# Patient Record
Sex: Female | Born: 1995 | Race: Asian | Hispanic: No | Marital: Single | Smoking: Never smoker
Health system: Southern US, Community
[De-identification: ages and names within clinical notes are randomized; demographics above are authoritative.]

## PROBLEM LIST (undated history)

## (undated) HISTORY — PX: LAPAROSCOPIC GASTRIC SLEEVE RESECTION: SHX5895

---
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
## 2020-07-16 ENCOUNTER — Emergency Department (HOSPITAL_COMMUNITY): Payer: No Typology Code available for payment source

## 2020-07-16 ENCOUNTER — Emergency Department (HOSPITAL_COMMUNITY)
Admission: EM | Admit: 2020-07-16 | Discharge: 2020-07-16 | Disposition: A | Discharge status: Home / Self Care | Payer: No Typology Code available for payment source | Attending: Emergency Medicine | Admitting: Emergency Medicine

## 2020-07-16 ENCOUNTER — Encounter (HOSPITAL_COMMUNITY): Payer: Self-pay | Admitting: *Deleted

## 2020-07-16 DIAGNOSIS — R000 Tachycardia, unspecified: Secondary | ICD-10-CM | POA: Insufficient documentation

## 2020-07-16 DIAGNOSIS — Y990 Civilian activity done for income or pay: Secondary | ICD-10-CM | POA: Diagnosis not present

## 2020-07-16 DIAGNOSIS — X501XXA Overexertion from prolonged static or awkward postures, initial encounter: Secondary | ICD-10-CM | POA: Diagnosis not present

## 2020-07-16 DIAGNOSIS — S93602A Unspecified sprain of left foot, initial encounter: Secondary | ICD-10-CM | POA: Insufficient documentation

## 2020-07-16 DIAGNOSIS — S99922A Unspecified injury of left foot, initial encounter: Secondary | ICD-10-CM | POA: Diagnosis present

## 2020-07-16 NOTE — Discharge Instructions (Signed)
The crutches as often as you need if it is very painful to walk.  Continue to ice.  You can take 2 Tylenol and 2 ibuprofen at the same time every 6 hours for pain.

## 2020-07-16 NOTE — ED Triage Notes (Signed)
Pt complains of left foot pain since falling yesterday. Painful with weight bearing, she is using crutches.

## 2020-07-16 NOTE — ED Provider Notes (Signed)
[facility]-EMERGENCY DEPT Provider Note   CSN: [id number] Arrival date & time: 07/16/20  1009     History Chief Complaint  Patient presents with  . Foot Pain    Paige Mcfarland is a 25 y.o. female.  The history is provided by the patient.  Foot Pain This is a new (Patient was walking at work and slipped and twisted her left foot) problem. The current episode started yesterday. The problem occurs constantly. The problem has not changed since onset.Associated symptoms comments: Persistent pain and swelling in the left foot.  Unable to bear weight because it is too painful.  No other areas of injury.. The symptoms are aggravated by walking. The symptoms are relieved by rest and ice. She has tried rest for the symptoms. The treatment provided mild relief.       History reviewed. No pertinent past medical history.  There are no problems to display for this patient.   History reviewed. No pertinent surgical history.   OB History   No obstetric history on file.     No family history on file.     Home Medications Prior to Admission medications   Not on File    Allergies    Patient has no allergy information on record.  Review of Systems   Review of Systems  All other systems reviewed and are negative.   Physical Exam Updated Vital Signs BP 127/87 (BP Location: Right Arm)   Pulse (!) 107   Temp 98 F (36.7 C) (Oral)   Resp 18   Ht 5\' 5"  (1.651 m)   Wt 126.1 kg   SpO2 99%   BMI 46.26 kg/m   Physical Exam Vitals and nursing note reviewed.  Constitutional:      General: She is not in acute distress.    Appearance: Normal appearance.  HENT:     Head: Normocephalic.  Cardiovascular:     Rate and Rhythm: Tachycardia present.     Pulses: Normal pulses.  Pulmonary:     Effort: Pulmonary effort is normal.  Musculoskeletal:        General: Tenderness present.     Left ankle: Normal.     Left foot: Tenderness and bony tenderness present.        Feet:  Skin:    General: Skin is warm and dry.  Neurological:     General: No focal deficit present.     Mental Status: She is alert and oriented to person, place, and time. Mental status is at baseline.  Psychiatric:        Mood and Affect: Mood normal.        Behavior: Behavior normal.     ED Results / Procedures / Treatments   Labs (all labs ordered are listed, but only abnormal results are displayed) Labs Reviewed - No data to display  EKG None  Radiology DG Foot Complete Left  Result Date: 07/16/2020 CLINICAL DATA:  Twisting injury yesterday with lateral foot pain, initial encounter EXAM: LEFT FOOT - COMPLETE 3+ VIEW COMPARISON:  None. FINDINGS: There is no evidence of fracture or dislocation. There is no evidence of arthropathy or other focal bone abnormality. Soft tissues are unremarkable. IMPRESSION: No acute abnormality noted. Electronically Signed   By: 07/18/2020 M.D.   On: 07/16/2020 11:10    Procedures Procedures   Medications Ordered in ED Medications - No data to display  ED Course  I have reviewed the triage vital signs and the nursing notes.  Pertinent labs & imaging results that were available during my care of the patient were reviewed by me and considered in my medical decision making (see chart for details).    MDM Rules/Calculators/A&P                          Patient presenting after injury to the foot yesterday and inability to bear weight due to pain.  No evidence of ankle or knee injury.  Plain films pending 11:51 AM Patient treated for sprain.  X-ray is negative.  Will give follow-up with sports medicine if symptoms do not improve.  MDM Number of Diagnoses or Management Options   Amount and/or Complexity of Data Reviewed Tests in the radiology section of CPT: ordered and reviewed Independent visualization of images, tracings, or specimens: yes     Final Clinical Impression(s) / ED Diagnoses Final diagnoses:  Sprain of left  foot, initial encounter    Rx / DC Orders ED Discharge Orders    None       Gwyneth Sprout, MD 07/16/20 1151

## 2020-07-31 ENCOUNTER — Emergency Department (HOSPITAL_BASED_OUTPATIENT_CLINIC_OR_DEPARTMENT_OTHER)
Admission: EM | Admit: 2020-07-31 | Discharge: 2020-07-31 | Disposition: A | Discharge status: Home / Self Care | Payer: Medicaid Other | Attending: Emergency Medicine | Admitting: Emergency Medicine

## 2020-07-31 ENCOUNTER — Other Ambulatory Visit: Payer: Self-pay

## 2020-07-31 ENCOUNTER — Encounter (HOSPITAL_BASED_OUTPATIENT_CLINIC_OR_DEPARTMENT_OTHER): Payer: Self-pay | Admitting: *Deleted

## 2020-07-31 DIAGNOSIS — R509 Fever, unspecified: Secondary | ICD-10-CM | POA: Diagnosis present

## 2020-07-31 DIAGNOSIS — U071 COVID-19: Secondary | ICD-10-CM | POA: Insufficient documentation

## 2020-07-31 DIAGNOSIS — B342 Coronavirus infection, unspecified: Secondary | ICD-10-CM

## 2020-07-31 LAB — COMPREHENSIVE METABOLIC PANEL
ALT: 17 U/L (ref 0–44)
AST: 18 U/L (ref 15–41)
Albumin: 3.6 g/dL (ref 3.5–5.0)
Alkaline Phosphatase: 82 U/L (ref 38–126)
Anion gap: 9 (ref 5–15)
BUN: 9 mg/dL (ref 6–20)
CO2: 23 mmol/L (ref 22–32)
Calcium: 8.6 mg/dL — ABNORMAL LOW (ref 8.9–10.3)
Chloride: 104 mmol/L (ref 98–111)
Creatinine, Ser: 0.88 mg/dL (ref 0.44–1.00)
GFR, Estimated: >60 mL/min (ref >60)
Glucose, Bld: 103 mg/dL — ABNORMAL HIGH (ref 70–99)
Potassium: 3.6 mmol/L (ref 3.5–5.1)
Sodium: 136 mmol/L (ref 135–145)
Total Bilirubin: 0.2 mg/dL — ABNORMAL LOW (ref 0.3–1.2)
Total Protein: 7.5 g/dL (ref 6.5–8.1)

## 2020-07-31 LAB — CBC WITH DIFFERENTIAL/PLATELET
Abs Immature Granulocytes: 0.03 10*3/uL (ref 0.00–0.07)
Basophils Absolute: 0 10*3/uL (ref 0.0–0.1)
Basophils Relative: 0 %
Eosinophils Absolute: 0 10*3/uL (ref 0.0–0.5)
Eosinophils Relative: 0 %
HCT: 37.7 % (ref 36.0–46.0)
Hemoglobin: 11.9 g/dL — ABNORMAL LOW (ref 12.0–15.0)
Immature Granulocytes: 1 %
Lymphocytes Relative: 9 %
Lymphs Abs: 0.5 10*3/uL — ABNORMAL LOW (ref 0.7–4.0)
MCH: 24.3 pg — ABNORMAL LOW (ref 26.0–34.0)
MCHC: 31.6 g/dL (ref 30.0–36.0)
MCV: 76.9 fL — ABNORMAL LOW (ref 80.0–100.0)
Monocytes Absolute: 0.5 10*3/uL (ref 0.1–1.0)
Monocytes Relative: 9 %
Neutro Abs: 4.6 10*3/uL (ref 1.7–7.7)
Neutrophils Relative %: 81 %
Platelets: 272 10*3/uL (ref 150–400)
RBC: 4.9 MIL/uL (ref 3.87–5.11)
RDW: 15.3 % (ref 11.5–15.5)
WBC: 5.7 10*3/uL (ref 4.0–10.5)
nRBC: 0 % (ref 0.0–0.2)

## 2020-07-31 LAB — RESP PANEL BY RT-PCR (FLU A&B, COVID) ARPGX2
Influenza A by PCR: NEGATIVE
Influenza B by PCR: NEGATIVE
SARS Coronavirus 2 by RT PCR: POSITIVE — AB

## 2020-07-31 MED ORDER — ONDANSETRON HCL 4 MG/2ML IJ SOLN
4.0000 mg | Freq: Once | INTRAMUSCULAR | Status: AC
  Administered 2020-07-31: 4 mg via INTRAVENOUS

## 2020-07-31 MED ORDER — SODIUM CHLORIDE 0.9 % IV BOLUS
1000.0000 mL | Freq: Once | INTRAVENOUS | Status: AC
  Administered 2020-07-31: 1000 mL via INTRAVENOUS

## 2020-07-31 MED ORDER — IBUPROFEN 800 MG PO TABS
800.0000 mg | ORAL_TABLET | Freq: Once | ORAL | Status: AC
  Administered 2020-07-31: 800 mg via ORAL
  Filled 2020-07-31: qty 1

## 2020-07-31 MED ORDER — ONDANSETRON HCL 4 MG/2ML IJ SOLN
INTRAMUSCULAR | Status: AC
  Filled 2020-07-31: qty 2

## 2020-07-31 MED ORDER — SODIUM CHLORIDE 0.9 % IV BOLUS
500.0000 mL | Freq: Once | INTRAVENOUS | Status: AC
  Administered 2020-07-31: 500 mL via INTRAVENOUS

## 2020-07-31 NOTE — ED Triage Notes (Signed)
Presents to the ED with complaints of body aches, fevers, feeling dizzy, having a HA. Poor appetite, has had some diarrhea with some nausea, no vomiting.

## 2020-07-31 NOTE — ED Provider Notes (Signed)
[facility] EMERGENCY DEPARTMENT Provider Note   CSN: [id number] Arrival date & time: 07/31/20  7829     History Chief Complaint  Patient presents with   Migraine    Paige Mcfarland is a 25 y.o. female.  Patient presents to the emergency department with chest pain and fever.  She reports that last night she started having a cough, congestion, rhinorrhea, and a fever of 104.5 F.  She reports that she took an antipyretic and her fever resolved but this morning she woke up again feeling terrible with elevated temperature.  Her cough is worsened and she is now having chest pain with every cough.  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  She reports that she did not work that week.  She is vaccinated against COVID-19 with a vaccine from the [place].   History reviewed. No pertinent past medical history.  There are no problems to display for this patient.   History reviewed. No pertinent surgical history.   OB History   No obstetric history on file.    History reviewed. No pertinent family history.   Home Medications Prior to Admission medications   Not on File    Allergies    Patient has no allergy information on record.  Review of Systems   Review of Systems  Constitutional:  Positive for chills, fatigue and fever.  HENT:  Positive for congestion and rhinorrhea.   Respiratory:  Positive for cough and shortness of breath.   Cardiovascular:  Positive for chest pain.  Gastrointestinal:  Positive for diarrhea and nausea. Negative for abdominal pain and vomiting.  Endocrine: Negative for polyuria.  Genitourinary:  Positive for decreased urine volume. Negative for difficulty urinating.  Musculoskeletal:  Positive for myalgias.  Neurological:  Positive for headaches.   Physical Exam Updated Vital Signs BP 136/83 (BP Location: Right Arm)   Pulse (!) 131   Temp (!) 100.4 F  (38 C) (Oral)   Resp 20   Ht 5\' 4"  (1.626 m)   Wt 122.5 kg   LMP 07/09/2020 (Approximate)   SpO2 96%   BMI 46.35 kg/m   Physical Exam Constitutional:      Appearance: She is ill-appearing.  HENT:     Head: Normocephalic and atraumatic.     Nose: Congestion and rhinorrhea present.     Mouth/Throat:     Mouth: Mucous membranes are dry.     Pharynx: Posterior oropharyngeal erythema present. No oropharyngeal exudate.  Eyes:     Extraocular Movements: Extraocular movements intact.     Conjunctiva/sclera: Conjunctivae normal.     Pupils: Pupils are equal, round, and reactive to light.  Cardiovascular:     Rate and Rhythm: Normal rate and regular rhythm.     Heart sounds: No murmur heard. Pulmonary:     Effort: Pulmonary effort is normal.     Breath sounds: Normal breath sounds.  Abdominal:     General: Bowel sounds are normal.     Palpations: Abdomen is soft.  Musculoskeletal:        General: Normal range of motion.     Cervical back: Normal range of motion.  Skin:    General: Skin is warm and dry.  Neurological:     General: No focal deficit present.     Mental Status: She is alert.  Psychiatric:        Mood and Affect: Mood  normal.    ED Results / Procedures / Treatments   Labs (all labs ordered are listed, but only abnormal results are displayed) Labs Reviewed  CBC WITH DIFFERENTIAL/PLATELET  COMPREHENSIVE METABOLIC PANEL  URINALYSIS, ROUTINE W REFLEX MICROSCOPIC  PREGNANCY, URINE    EKG EKG Interpretation  Date/Time:  Thursday July 31 2020 09:37:04 EDT Ventricular Rate:  123 PR Interval:  148 QRS Duration: 83 QT Interval:  295 QTC Calculation: 422 R Axis:   66 Text Interpretation: Sinus tachycardia No previous tracing Confirmed by Gwyneth Sprout ([id number]) on 07/31/2020 9:41:14 AM  Radiology No results found.  Procedures Procedures   Medications Ordered in ED Medications  ibuprofen (ADVIL) tablet 800 mg (has no administration in time range)   sodium chloride 0.9 % bolus 1,000 mL (has no administration in time range)    ED Course  I have reviewed the triage vital signs and the nursing notes.  Pertinent labs & imaging results that were available during my care of the patient were reviewed by me and considered in my medical decision making (see chart for details).    MDM Rules/Calculators/A&P                          25 year old patient presents to the emergency department with complaints of fever, cough, congestion, body aches.  Patient was tachycardic on arrival with heart rate in the 130s-140s.  EKG was collected and showed sinus tachycardia.  Fever of 100.4 F on arrival.  She reports that she has had decreased p.o. fluid intake over the last 24 hours due to this illness.  She reports fever of 104.5 F last night.  Patient is vaccinated against COVID but the vaccine was over a year ago.  CBC, CMP, urinalysis ordered.  Likely feel that this is COVID-19.  COVID-19 test ordered.  Patient given a normal saline bolus of 1 L.  Also given 800 mg ibuprofen.  CBC showed WBC 5.7, hemoglobin 11.9.  CMP showed no gross abnormalities.  After 1 L fluid bolus patient's heart rate decreased to 110 from 140.  Patient is given another 500 mL bolus of fluids and started on maintenance fluids.  Patient's tachycardia continued to improve.  Patient's COVID test came back positive.  Went to reevaluate the patient the patient was feeling considerably better.  Her heart rate was below 100 and her oxygen saturation was 100% on room air.  She was discharged home with strict return precautions.  She must quarantine until 6/14.  Patient had no further questions or concerns.  Final Clinical Impression(s) / ED Diagnoses Final diagnoses:  None    Rx / DC Orders ED Discharge Orders     None        Derrel Nip, MD 07/31/20 1308    Gwyneth Sprout, MD 08/01/20 1559

## 2020-07-31 NOTE — Discharge Instructions (Signed)
Is a pleasure meeting you today.  You were seen for an elevated fever and shortness of breath.  We tested you for COVID-19 which came back positive.  You appear dehydrated on initial evaluation so we gave you some IV fluids which helped rehydrate you and slowed her heart rate down.  After discharge the important thing is to continue to drink plenty of fluids.  I would also like you use Tylenol or ibuprofen every 6-8 hours for fever, body aches, chills.  Given your COVID-19 infection you will need to quarantine for 5 days after the onset of symptoms.  You then wear a mask for an additional 5 days.  If you have worsening shortness of breath, chest pain, lethargy please seek medical attention immediately.  I hope you have a wonderful afternoon to get to feeling better.

## 2020-07-31 NOTE — ED Notes (Signed)
Still unable to void at this time or have the urge to void, will reassess post 2nd IVF bolus of NS 

## 2020-08-05 ENCOUNTER — Encounter (HOSPITAL_BASED_OUTPATIENT_CLINIC_OR_DEPARTMENT_OTHER): Payer: Self-pay | Admitting: *Deleted

## 2020-08-05 ENCOUNTER — Emergency Department (HOSPITAL_BASED_OUTPATIENT_CLINIC_OR_DEPARTMENT_OTHER): Payer: Medicaid Other

## 2020-08-05 ENCOUNTER — Emergency Department (HOSPITAL_BASED_OUTPATIENT_CLINIC_OR_DEPARTMENT_OTHER)
Admission: EM | Admit: 2020-08-05 | Discharge: 2020-08-06 | Disposition: A | Discharge status: Home / Self Care | Payer: Medicaid Other | Attending: Emergency Medicine | Admitting: Emergency Medicine

## 2020-08-05 ENCOUNTER — Other Ambulatory Visit: Payer: Self-pay

## 2020-08-05 DIAGNOSIS — R0602 Shortness of breath: Secondary | ICD-10-CM | POA: Diagnosis present

## 2020-08-05 DIAGNOSIS — K920 Hematemesis: Secondary | ICD-10-CM | POA: Insufficient documentation

## 2020-08-05 DIAGNOSIS — R000 Tachycardia, unspecified: Secondary | ICD-10-CM | POA: Diagnosis not present

## 2020-08-05 DIAGNOSIS — U071 COVID-19: Secondary | ICD-10-CM | POA: Diagnosis not present

## 2020-08-05 LAB — COMPREHENSIVE METABOLIC PANEL
ALT: 33 U/L (ref 0–44)
AST: 38 U/L (ref 15–41)
Albumin: 3.8 g/dL (ref 3.5–5.0)
Alkaline Phosphatase: 84 U/L (ref 38–126)
Anion gap: 9 (ref 5–15)
BUN: 9 mg/dL (ref 6–20)
CO2: 21 mmol/L — ABNORMAL LOW (ref 22–32)
Calcium: 8.7 mg/dL — ABNORMAL LOW (ref 8.9–10.3)
Chloride: 106 mmol/L (ref 98–111)
Creatinine, Ser: 0.82 mg/dL (ref 0.44–1.00)
GFR, Estimated: >60 mL/min (ref >60)
Glucose, Bld: 118 mg/dL — ABNORMAL HIGH (ref 70–99)
Potassium: 4.1 mmol/L (ref 3.5–5.1)
Sodium: 136 mmol/L (ref 135–145)
Total Bilirubin: 0.3 mg/dL (ref 0.3–1.2)
Total Protein: 7.9 g/dL (ref 6.5–8.1)

## 2020-08-05 LAB — CBC WITH DIFFERENTIAL/PLATELET
Abs Immature Granulocytes: 0.04 10*3/uL (ref 0.00–0.07)
Basophils Absolute: 0.1 10*3/uL (ref 0.0–0.1)
Basophils Relative: 1 %
Eosinophils Absolute: 0.1 10*3/uL (ref 0.0–0.5)
Eosinophils Relative: 1 %
HCT: 43.1 % (ref 36.0–46.0)
Hemoglobin: 13.7 g/dL (ref 12.0–15.0)
Immature Granulocytes: 1 %
Lymphocytes Relative: 49 %
Lymphs Abs: 3.2 10*3/uL (ref 0.7–4.0)
MCH: 23.9 pg — ABNORMAL LOW (ref 26.0–34.0)
MCHC: 31.8 g/dL (ref 30.0–36.0)
MCV: 75.2 fL — ABNORMAL LOW (ref 80.0–100.0)
Monocytes Absolute: 0.4 10*3/uL (ref 0.1–1.0)
Monocytes Relative: 6 %
Neutro Abs: 2.7 10*3/uL (ref 1.7–7.7)
Neutrophils Relative %: 42 %
Platelets: 233 10*3/uL (ref 150–400)
RBC: 5.73 MIL/uL — ABNORMAL HIGH (ref 3.87–5.11)
RDW: 15 % (ref 11.5–15.5)
WBC: 6.7 10*3/uL (ref 4.0–10.5)
nRBC: 0 % (ref 0.0–0.2)

## 2020-08-05 LAB — D-DIMER, QUANTITATIVE: D-Dimer, Quant: 0.87 ug/mL-FEU — ABNORMAL HIGH (ref 0.00–0.50)

## 2020-08-05 MED ORDER — ONDANSETRON HCL 4 MG/2ML IJ SOLN
4.0000 mg | Freq: Once | INTRAMUSCULAR | Status: AC
  Administered 2020-08-05: 4 mg via INTRAVENOUS
  Filled 2020-08-05: qty 2

## 2020-08-05 MED ORDER — ACETAMINOPHEN 325 MG PO TABS
650.0000 mg | ORAL_TABLET | Freq: Once | ORAL | Status: AC
  Administered 2020-08-05: 650 mg via ORAL
  Filled 2020-08-05: qty 2

## 2020-08-05 MED ORDER — METHYLPREDNISOLONE SODIUM SUCC 125 MG IJ SOLR: 125.0000 mg | Freq: Once | INTRAMUSCULAR | Status: DC | PRN

## 2020-08-05 MED ORDER — BEBTELOVIMAB 175 MG/2 ML IV (EUA)
175.0000 mg | Freq: Once | INTRAMUSCULAR | Status: AC
  Administered 2020-08-06: 175 mg via INTRAVENOUS
  Filled 2020-08-05: qty 2

## 2020-08-05 MED ORDER — SODIUM CHLORIDE 0.9 % IV BOLUS
1000.0000 mL | Freq: Once | INTRAVENOUS | Status: AC
  Administered 2020-08-05: 1000 mL via INTRAVENOUS

## 2020-08-05 MED ORDER — IOHEXOL 350 MG/ML SOLN
100.0000 mL | Freq: Once | INTRAVENOUS | Status: AC | PRN
  Administered 2020-08-05: 100 mL via INTRAVENOUS

## 2020-08-05 MED ORDER — SODIUM CHLORIDE 0.9 % IV SOLN: INTRAVENOUS | Status: DC | PRN

## 2020-08-05 MED ORDER — EPINEPHRINE 0.3 MG/0.3ML IJ SOAJ: 0.3000 mg | Freq: Once | INTRAMUSCULAR | Status: DC | PRN

## 2020-08-05 MED ORDER — ALBUTEROL SULFATE HFA 108 (90 BASE) MCG/ACT IN AERS: 2.0000 | INHALATION_SPRAY | Freq: Once | RESPIRATORY_TRACT | Status: DC | PRN

## 2020-08-05 MED ORDER — DIPHENHYDRAMINE HCL 50 MG/ML IJ SOLN: 50.0000 mg | Freq: Once | INTRAMUSCULAR | Status: DC | PRN

## 2020-08-05 MED ORDER — FAMOTIDINE IN NACL 20-0.9 MG/50ML-% IV SOLN: 20.0000 mg | Freq: Once | INTRAVENOUS | Status: DC | PRN

## 2020-08-05 NOTE — ED Notes (Signed)
Pt tachypneic and appears anxious. Pt instructed on slow deep breathing, states her chest hurts and is dizzy. Pt tearful.

## 2020-08-05 NOTE — ED Triage Notes (Signed)
Covid positive 5 days ago. Here with sob and coughing so hard she sees blood in the returns.

## 2020-08-05 NOTE — ED Notes (Signed)
Patient transported to CT 

## 2020-08-05 NOTE — ED Provider Notes (Signed)
11:40 PM Care assumed from [facility].  At time of transfer care, is awaiting on her pregnancy test to be negative for she gets a coronavirus infusion treatment.  After infusion treatment, she will be monitored for 1 hour and then discharged home.  Anticipate follow-up with PCP.  Patient's pregnancy test having some interfering substance and was unable to resolve.  Patient was able to personally test she is not pregnant and was able to get the infusion.  Patient had the infusion and was monitored for over an hour without acute complication.  Patient will be stable for discharge home and outpatient follow-up.  Clinical Impression: 1. COVID-19     Disposition: Discharge  Condition: Good  I have discussed the results, Dx and Tx plan with the pt(& family if present). He/she/they expressed understanding and agree(s) with the plan. Discharge instructions discussed at great length. Strict return precautions discussed and pt &/or family have verbalized understanding of the instructions. No further questions at time of discharge.    New Prescriptions   No medications on file    Follow Up: Therapy, [facility] [address]  Laurell Josephs [address] [place] [address] [phone]     [facility] [place] [facility]MENT [address] [id number] [address] [facility] [place] [address] [phone]         Maquita Sandoval, Canary Brim, MD 08/06/20 [phone]

## 2020-08-05 NOTE — ED Notes (Signed)
Pt unable to urinate at this time, NS running, urine spec cup at bedside.  Awaiting urine prior to admin monoclonal antibody infusion

## 2020-08-05 NOTE — ED Notes (Signed)
Pt ambulated with pulseox, spo2 down to 90% on RA while ambulating, back up to 97% after

## 2020-08-05 NOTE — ED Provider Notes (Signed)
[facility] EMERGENCY DEPARTMENT Provider Note   CSN: [id number] Arrival date & time: 08/05/20  2020     History Chief Complaint  Patient presents with   Covid Positive   Shortness of Breath    Paige Mcfarland is a 25 y.o. female.  HPI 25 year old female with no significant medical history presents to the ER with complaints of shortness of breath, chest pain, hematemesis and hemoptysis in the setting of a COVID-19 diagnosis which occurred 5 days ago.  Patient states that she has had some nausea, and scant vomiting.  She noticed some blood-tinged sputum yesterday which has progressed into today.  She also endorsed 1 episode of hematemesis.  Has not noticed any dark or tarry stools.  She arrived very anxious appearing, tachypneic, tachycardic.  She endorses pleuritic symptoms with pain on inspiration.  She is vaccinated for COVID-19.  No noticeable leg swelling.  Not on OCPs.    History reviewed. No pertinent past medical history.  There are no problems to display for this patient.   History reviewed. No pertinent surgical history.   OB History   No obstetric history on file.     No family history on file.     Home Medications Prior to Admission medications   Not on File    Allergies    Patient has no known allergies.  Review of Systems   Review of Systems Ten systems reviewed and are negative for acute change, except as noted in the HPI.   Physical Exam Updated Vital Signs BP 125/90 (BP Location: Left Arm)   Pulse (!) 111   Temp 100.3 F (37.9 C) (Oral)   Resp 18   Ht 5\' 4"  (1.626 m)   Wt 122.5 kg   LMP 07/09/2020 (Approximate)   SpO2 100%   BMI 46.36 kg/m   Physical Exam Vitals and nursing note reviewed.  Constitutional:      General: She is not in acute distress.    Appearance: She is well-developed. She is ill-appearing.  HENT:     Head: Normocephalic and atraumatic.  Eyes:     Conjunctiva/sclera: Conjunctivae normal.  Cardiovascular:      Rate and Rhythm: Regular rhythm. Tachycardia present.     Heart sounds: No murmur heard. Pulmonary:     Effort: Pulmonary effort is normal. Tachypnea present. No respiratory distress.     Breath sounds: Normal breath sounds.  Chest:     Chest wall: No deformity or tenderness.  Abdominal:     Palpations: Abdomen is soft.     Tenderness: There is no abdominal tenderness.  Musculoskeletal:     Cervical back: Neck supple.     Right lower leg: No edema.     Left lower leg: No edema.  Skin:    General: Skin is warm and dry.  Neurological:     General: No focal deficit present.     Mental Status: She is alert.  Psychiatric:        Mood and Affect: Mood is anxious.    ED Results / Procedures / Treatments   Labs (all labs ordered are listed, but only abnormal results are displayed) Labs Reviewed  CBC WITH DIFFERENTIAL/PLATELET - Abnormal; Notable for the following components:      Result Value   RBC 5.73 (*)    MCV 75.2 (*)    MCH 23.9 (*)    All other components within normal limits  COMPREHENSIVE METABOLIC PANEL - Abnormal; Notable for the following components:   CO2  21 (*)    Glucose, Bld 118 (*)    Calcium 8.7 (*)    All other components within normal limits  D-DIMER, QUANTITATIVE - Abnormal; Notable for the following components:   D-Dimer, Quant 0.87 (*)    All other components within normal limits  POC URINE PREG, ED    EKG EKG Interpretation  Date/Time:  Tuesday August 05 2020 20:38:21 EDT Ventricular Rate:  134 PR Interval:  131 QRS Duration: 87 QT Interval:  300 QTC Calculation: 448 R Axis:   82 Text Interpretation: Sinus tachycardia Multiform ventricular premature complexes normal axis No acute ischemia Confirmed by Pieter Partridge ([id number]) on 08/05/2020 9:32:20 PM  Radiology CT Angio Chest PE W and/or Wo Contrast  Result Date: 08/05/2020 CLINICAL DATA:  Shortness of breath, cough.  COVID. EXAM: CT ANGIOGRAPHY CHEST WITH CONTRAST TECHNIQUE: Multidetector CT  imaging of the chest was performed using the standard protocol during bolus administration of intravenous contrast. Multiplanar CT image reconstructions and MIPs were obtained to evaluate the vascular anatomy. CONTRAST:  OMNIPAQUE IOHEXOL 350 MG/ML SOLN COMPARISON:  None. FINDINGS: Cardiovascular: No filling defects in the pulmonary arteries to suggest pulmonary emboli. Heart is normal size. Aorta is normal caliber. Mediastinum/Nodes: No mediastinal, hilar, or axillary adenopathy. Trachea and esophagus are unremarkable. Thyroid unremarkable. Lungs/Pleura: Lungs are clear. No focal airspace opacities or suspicious nodules. No effusions. Upper Abdomen: Imaging into the upper abdomen demonstrates no acute findings. Musculoskeletal: Chest wall soft tissues are unremarkable. No acute bony abnormality. Review of the MIP images confirms the above findings. IMPRESSION: No evidence of pulmonary embolus. No acute cardiopulmonary disease. Electronically Signed   By: Charlett Nose M.D.   On: 08/05/2020 22:40   DG Chest Portable 1 View  Result Date: 08/05/2020 CLINICAL DATA:  Shortness of breath and cough. COVID positive 5 days ago. EXAM: PORTABLE CHEST 1 VIEW COMPARISON:  None. FINDINGS: Shallow inspiration. The heart size and mediastinal contours are within normal limits. Both lungs are clear. The visualized skeletal structures are unremarkable. IMPRESSION: No active disease. Electronically Signed   By: Burman Nieves M.D.   On: 08/05/2020 21:09    Procedures Procedures   Medications Ordered in ED Medications  0.9 %  sodium chloride infusion (has no administration in time range)  bebtelovimab EUA injection SOLN 175 mg (has no administration in time range)  diphenhydrAMINE (BENADRYL) injection 50 mg (has no administration in time range)  famotidine (PEPCID) IVPB 20 mg premix (has no administration in time range)  methylPREDNISolone sodium succinate (SOLU-MEDROL) 125 mg/2 mL injection 125 mg (has no  administration in time range)  albuterol (VENTOLIN HFA) 108 (90 Base) MCG/ACT inhaler 2 puff (has no administration in time range)  EPINEPHrine (EPI-PEN) injection 0.3 mg (has no administration in time range)  acetaminophen (TYLENOL) tablet 650 mg (650 mg Oral Given 08/05/20 2033)  ondansetron (ZOFRAN) injection 4 mg (4 mg Intravenous Given 08/05/20 2133)  iohexol (OMNIPAQUE) 350 MG/ML injection 100 mL (100 mLs Intravenous Contrast Given 08/05/20 2225)  sodium chloride 0.9 % bolus 1,000 mL (1,000 mLs Intravenous New Bag/Given 08/05/20 2253)    ED Course  I have reviewed the triage vital signs and the nursing notes.  Pertinent labs & imaging results that were available during my care of the patient were reviewed by me and considered in my medical decision making (see chart for details).    MDM Rules/Calculators/A&P  25 year old female presents to the ER with tachycardia, reported hemoptysis and hematemesis in the setting of recent COVID-19 diagnosis.  On arrival, she arrived tachycardic with a rate of 150, febrile of 101.1.  Tachycardia did slowly improve throughout the ED course, fever improved with Tylenol.  Lung sounds clear, however she is very anxious appearing.  No visible lower extremity swelling.  Abdomen is soft and nontender.  Labs and imaging ordered, reviewed and interpreted by me.  CBC without leukocytosis, CMP largely unremarkable with no significant electrode abnormalities, normal renal function.  Chest x-ray without evidence of pneumonia, EKG was sinus tach.  Abdomen is soft and nontender, lower suspicion for GI bleed.  Given reports of hemoptysis in the setting of COVID-19, D-dimer was ordered.  This was elevated at 0.87, however CT PE study was negative.  Patient is in the high risk category given her BMI, she unfortunately is out of the window for Paxlovid.  She was ambulated in the ER, she did desaturate to 90%, but quickly improved back to 97 with  rest.  Patient receiving 1 L fluid bolus.  Ordered bebtelovimab infusion.   Signed out care to Dr. Rush Landmark who will oversee her infusion, observe, and likely discharge given reassuring workup.    Final Clinical Impression(s) / ED Diagnoses Final diagnoses:  COVID-19    Rx / DC Orders ED Discharge Orders     None        Leone Brand 08/05/20 2311    Koleen Distance, MD 08/05/20 2314

## 2020-08-05 NOTE — ED Notes (Signed)
Unsuccessful IV attempt x1. 

## 2020-08-06 LAB — PREGNANCY, URINE

## 2020-08-06 NOTE — Discharge Instructions (Addendum)
Please follow-up with your primary doctor for further management.  You received an infusion today which will improve the likelihood that you will not have any worsening COVID symptoms or need to be admitted in the future.  Please rest and stay hydrated if any symptoms change acutely, please return to the nearest emergency department for reevaluation.Marland Kitchen

## 2022-01-19 ENCOUNTER — Other Ambulatory Visit: Payer: Self-pay

## 2022-01-19 ENCOUNTER — Emergency Department (HOSPITAL_BASED_OUTPATIENT_CLINIC_OR_DEPARTMENT_OTHER): Payer: Medicaid Other

## 2022-01-19 ENCOUNTER — Emergency Department (HOSPITAL_BASED_OUTPATIENT_CLINIC_OR_DEPARTMENT_OTHER)
Admission: EM | Admit: 2022-01-19 | Discharge: 2022-01-19 | Disposition: A | Discharge status: Home / Self Care | Payer: Medicaid Other | Attending: Emergency Medicine | Admitting: Emergency Medicine

## 2022-01-19 ENCOUNTER — Encounter (HOSPITAL_BASED_OUTPATIENT_CLINIC_OR_DEPARTMENT_OTHER): Payer: Self-pay | Admitting: Emergency Medicine

## 2022-01-19 DIAGNOSIS — R000 Tachycardia, unspecified: Secondary | ICD-10-CM | POA: Insufficient documentation

## 2022-01-19 DIAGNOSIS — K2901 Acute gastritis with bleeding: Secondary | ICD-10-CM | POA: Diagnosis not present

## 2022-01-19 DIAGNOSIS — R1013 Epigastric pain: Secondary | ICD-10-CM | POA: Diagnosis present

## 2022-01-19 LAB — COMPREHENSIVE METABOLIC PANEL
ALT: 9 U/L (ref 0–44)
AST: 17 U/L (ref 15–41)
Albumin: 4.1 g/dL (ref 3.5–5.0)
Alkaline Phosphatase: 83 U/L (ref 38–126)
Anion gap: 12 (ref 5–15)
BUN: 13 mg/dL (ref 6–20)
CO2: 19 mmol/L — ABNORMAL LOW (ref 22–32)
Calcium: 8.9 mg/dL (ref 8.9–10.3)
Chloride: 107 mmol/L (ref 98–111)
Creatinine, Ser: 0.75 mg/dL (ref 0.44–1.00)
GFR, Estimated: >60 mL/min (ref >60)
Glucose, Bld: 120 mg/dL — ABNORMAL HIGH (ref 70–99)
Potassium: 3.6 mmol/L (ref 3.5–5.1)
Sodium: 138 mmol/L (ref 135–145)
Total Bilirubin: 0.4 mg/dL (ref 0.3–1.2)
Total Protein: 7.8 g/dL (ref 6.5–8.1)

## 2022-01-19 LAB — CBC WITH DIFFERENTIAL/PLATELET
Abs Immature Granulocytes: 0.06 10*3/uL (ref 0.00–0.07)
Basophils Absolute: 0 10*3/uL (ref 0.0–0.1)
Basophils Relative: 0 %
Eosinophils Absolute: 0.1 10*3/uL (ref 0.0–0.5)
Eosinophils Relative: 0 %
HCT: 42.7 % (ref 36.0–46.0)
Hemoglobin: 14.1 g/dL (ref 12.0–15.0)
Immature Granulocytes: 0 %
Lymphocytes Relative: 15 %
Lymphs Abs: 2.9 10*3/uL (ref 0.7–4.0)
MCH: 27.5 pg (ref 26.0–34.0)
MCHC: 33 g/dL (ref 30.0–36.0)
MCV: 83.2 fL (ref 80.0–100.0)
Monocytes Absolute: 1.1 10*3/uL — ABNORMAL HIGH (ref 0.1–1.0)
Monocytes Relative: 6 %
Neutro Abs: 15.1 10*3/uL — ABNORMAL HIGH (ref 1.7–7.7)
Neutrophils Relative %: 79 %
Platelets: 413 10*3/uL — ABNORMAL HIGH (ref 150–400)
RBC: 5.13 MIL/uL — ABNORMAL HIGH (ref 3.87–5.11)
RDW: 13.3 % (ref 11.5–15.5)
WBC: 19.3 10*3/uL — ABNORMAL HIGH (ref 4.0–10.5)
nRBC: 0 % (ref 0.0–0.2)

## 2022-01-19 LAB — LIPASE, BLOOD: Lipase: 32 U/L (ref 11–51)

## 2022-01-19 LAB — MAGNESIUM: Magnesium: 1.9 mg/dL (ref 1.7–2.4)

## 2022-01-19 LAB — HCG, QUANTITATIVE, PREGNANCY: hCG, Beta Chain, Quant, S: <1 m[IU]/mL (ref <5)

## 2022-01-19 LAB — CK: Total CK: 48 U/L (ref 38–234)

## 2022-01-19 MED ORDER — ALUM & MAG HYDROXIDE-SIMETH 200-200-20 MG/5ML PO SUSP
30.0000 mL | Freq: Once | ORAL | Status: AC
  Administered 2022-01-19: 30 mL via ORAL
  Filled 2022-01-19: qty 30

## 2022-01-19 MED ORDER — PANTOPRAZOLE SODIUM 40 MG IV SOLR
40.0000 mg | Freq: Once | INTRAVENOUS | Status: AC
  Administered 2022-01-19: 40 mg via INTRAVENOUS
  Filled 2022-01-19: qty 10

## 2022-01-19 MED ORDER — ONDANSETRON 4 MG PO TBDP: 4.0000 mg | ORAL_TABLET | Freq: Three times a day (TID) | ORAL | 0 refills | Status: AC | PRN

## 2022-01-19 MED ORDER — LIDOCAINE VISCOUS HCL 2 % MT SOLN
15.0000 mL | Freq: Once | OROMUCOSAL | Status: AC
  Administered 2022-01-19: 15 mL via ORAL
  Filled 2022-01-19: qty 15

## 2022-01-19 MED ORDER — PANTOPRAZOLE SODIUM 40 MG PO TBEC: 40.0000 mg | DELAYED_RELEASE_TABLET | Freq: Every day | ORAL | 0 refills | Status: AC

## 2022-01-19 MED ORDER — ONDANSETRON HCL 4 MG/2ML IJ SOLN
4.0000 mg | Freq: Once | INTRAMUSCULAR | Status: AC
  Administered 2022-01-19: 4 mg via INTRAVENOUS
  Filled 2022-01-19: qty 2

## 2022-01-19 MED ORDER — HYDROMORPHONE HCL 1 MG/ML IJ SOLN
1.0000 mg | Freq: Once | INTRAMUSCULAR | Status: AC
  Administered 2022-01-19: 1 mg via INTRAVENOUS
  Filled 2022-01-19: qty 1

## 2022-01-19 MED ORDER — IOHEXOL 300 MG/ML  SOLN
100.0000 mL | Freq: Once | INTRAMUSCULAR | Status: AC | PRN
  Administered 2022-01-19: 100 mL via INTRAVENOUS

## 2022-01-19 MED ORDER — DIAZEPAM 5 MG/ML IJ SOLN
2.5000 mg | Freq: Once | INTRAMUSCULAR | Status: AC
  Administered 2022-01-19: 2.5 mg via INTRAVENOUS
  Filled 2022-01-19: qty 2

## 2022-01-19 MED ORDER — LACTATED RINGERS IV BOLUS
1000.0000 mL | Freq: Once | INTRAVENOUS | Status: AC
  Administered 2022-01-19: 1000 mL via INTRAVENOUS

## 2022-01-19 NOTE — ED Notes (Signed)
Pt continues to complain of muscle cramping and her fingers twisting. Pt also endorses BUE numbness. Pt breathing rapidly; pt provided with therapeutic communication and taught to control breathing with deep breathing exercises.

## 2022-01-19 NOTE — ED Triage Notes (Signed)
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 Started about 1 hour ago.

## 2022-01-19 NOTE — ED Notes (Signed)
Pt noted to have finger cramping when BP cuff inflating. Provider made aware.

## 2022-01-19 NOTE — ED Provider Notes (Signed)
[facility] EMERGENCY DEPARTMENT Provider Note   CSN: [id number] Arrival date & time: 01/19/22  0231     History  Chief Complaint  Patient presents with   Abdominal Pain    Paige Mcfarland is a 26 y.o. female.  Gastric sleeve about a year ago. She's unsure on the exact type. Has had a few episodes of emesis with blood in them prior to coming in epigastric pain as well. No fever. Some recent diarrhea but no blood in those. Has had some tingling and cramping in hands as well.    Abdominal Pain      Home Medications Prior to Admission medications   Medication Sig Start Date End Date Taking? Authorizing Provider  ondansetron (ZOFRAN-ODT) 4 MG disintegrating tablet Take 1 tablet (4 mg total) by mouth every 8 (eight) hours as needed. 4mg  ODT q4 hours prn nausea/vomit 01/19/22  Yes Cassia Fein, 01/21/22, MD  pantoprazole (PROTONIX) 40 MG tablet Take 1 tablet (40 mg total) by mouth daily for 10 days. 01/19/22 01/29/22 Yes Jejuan Scala, 14/8/23, MD      Allergies    Patient has no known allergies.    Review of Systems   Review of Systems  Gastrointestinal:  Positive for abdominal pain.    Physical Exam Updated Vital Signs BP (!) 119/48   Pulse 86   Temp 97.6 F (36.4 C) (Oral)   Resp 12   Ht 5\' 5"  (1.651 m)   Wt 77.1 kg   LMP 01/14/2022 (Approximate)   SpO2 99%   BMI 28.29 kg/m  Physical Exam Vitals and nursing note reviewed.  Constitutional:      Appearance: She is well-developed.  HENT:     Head: Normocephalic and atraumatic.     Mouth/Throat:     Mouth: Mucous membranes are moist.     Pharynx: Oropharynx is clear.  Eyes:     Pupils: Pupils are equal, round, and reactive to light.  Cardiovascular:     Rate and Rhythm: Regular rhythm. Tachycardia present.  Pulmonary:     Effort: No respiratory distress.     Breath sounds: No stridor.  Abdominal:     General: There is no distension.     Tenderness: There is abdominal tenderness in the epigastric area.   Musculoskeletal:     Cervical back: Normal range of motion.  Skin:    General: Skin is warm and dry.  Neurological:     General: No focal deficit present.     Mental Status: She is alert.     ED Results / Procedures / Treatments   Labs (all labs ordered are listed, but only abnormal results are displayed) Labs Reviewed  COMPREHENSIVE METABOLIC PANEL - Abnormal; Notable for the following components:      Result Value   CO2 19 (*)    Glucose, Bld 120 (*)    All other components within normal limits  CBC WITH DIFFERENTIAL/PLATELET - Abnormal; Notable for the following components:   WBC 19.3 (*)    RBC 5.13 (*)    Platelets 413 (*)    Neutro Abs 15.1 (*)    Monocytes Absolute 1.1 (*)    All other components within normal limits  LIPASE, BLOOD  HCG, QUANTITATIVE, PREGNANCY  CK  MAGNESIUM  URINALYSIS, ROUTINE W REFLEX MICROSCOPIC  PREGNANCY, URINE    EKG EKG Interpretation  Date/Time:  Tuesday January 19 2022 02:53:29 EST Ventricular Rate:  111 PR Interval:  166 QRS Duration: 97 QT Interval:  326 QTC Calculation: 443 R  Axis:   77 Text Interpretation: Sinus tachycardia Confirmed by Marily Memos [phone] on 01/19/2022 3:07:49 AM  Radiology CT ABDOMEN PELVIS W CONTRAST  Result Date: 01/19/2022 CLINICAL DATA:  History of laparoscopic gastric sleeve resection, presenting with abdominal pain. EXAM: CT ABDOMEN AND PELVIS WITH CONTRAST TECHNIQUE: Multidetector CT imaging of the abdomen and pelvis was performed using the standard protocol following bolus administration of intravenous contrast. RADIATION DOSE REDUCTION: This exam was performed according to the departmental dose-optimization program which includes automated exposure control, adjustment of the mA and/or kV according to patient size and/or use of iterative reconstruction technique. CONTRAST:  OMNIPAQUE IOHEXOL 300 MG/ML  SOLN COMPARISON:  None Available. FINDINGS: Lower chest: No acute abnormality.  Hepatobiliary: No focal liver abnormality is seen. No gallstones, gallbladder wall thickening, or biliary dilatation. Pancreas: Unremarkable. No pancreatic ductal dilatation or surrounding inflammatory changes. Spleen: Normal in size without focal abnormality. Adrenals/Urinary Tract: Adrenal glands are unremarkable. Kidneys are normal, without renal calculi, focal lesion, or hydronephrosis. Bladder is unremarkable. Stomach/Bowel: Surgical sutures are seen within the gastric region. Appendix appears normal. No evidence of bowel wall thickening, distention, or inflammatory changes. Vascular/Lymphatic: No significant vascular findings are present. No enlarged abdominal or pelvic lymph nodes. Reproductive: Uterus and bilateral adnexa are unremarkable. Other: No abdominal wall hernia or abnormality. No abdominopelvic ascites. Musculoskeletal: No acute or significant osseous findings. IMPRESSION: 1. Evidence of prior gastric sleeve resection. 2. No acute or active process within the abdomen or pelvis. Electronically Signed   By: Aram Candela M.D.   On: 01/19/2022 04:10    Procedures Procedures    Medications Ordered in ED Medications  HYDROmorphone (DILAUDID) injection 1 mg (1 mg Intravenous Given 01/19/22 0302)  ondansetron (ZOFRAN) injection 4 mg (4 mg Intravenous Given 01/19/22 0302)  lactated ringers bolus 1,000 mL (0 mLs Intravenous Stopped 01/19/22 0438)  pantoprazole (PROTONIX) injection 40 mg (40 mg Intravenous Given 01/19/22 0302)  iohexol (OMNIPAQUE) 300 MG/ML solution 100 mL (100 mLs Intravenous Contrast Given 01/19/22 0352)  alum & mag hydroxide-simeth (MAALOX/MYLANTA) 200-200-20 MG/5ML suspension 30 mL (30 mLs Oral Given 01/19/22 0428)    And  lidocaine (XYLOCAINE) 2 % viscous mouth solution 15 mL (15 mLs Oral Given 01/19/22 0428)  diazepam (VALIUM) injection 2.5 mg (2.5 mg Intravenous Given 01/19/22 0429)    ED Course/ Medical Decision Making/ A&P                           Medical  Decision Making Amount and/or Complexity of Data Reviewed Labs: ordered. Radiology: ordered.  Risk OTC drugs. Prescription drug management.   Initial concern for ruptured peptic ulcer, possible band erosion if that this epigastric surgery she received, fistula, peptic ulcer disease or gastritis.  Labs were ordered and overall unremarkable.  Patient would have episodes of rapid breathing associated with extremity cramping.  I suspect this is related to anxiety rather than any metabolic derangement as her labs were all reassuring including her CK.  CT scan was negative for any emergent causes.  It does not appear there is any bands in the I think she had a true gastric sleeve which makes the complications less likely.  At this time I think that she probably has an ulcer or gastritis.  Her symptoms have improved.  Her tachycardia improved with anxiety medication.  Discussed with him reasons to return to the ER such as more persistent bleeding, lightheadedness, skin pallor or anything else that changes or worsens.  CT  A/P done and showed no obvious abnormalities (independently viewed and interpreted by myself and radiology read reviewed).  Final Clinical Impression(s) / ED Diagnoses Final diagnoses:  Acute gastritis with hemorrhage, unspecified gastritis type    Rx / DC Orders ED Discharge Orders          Ordered    pantoprazole (PROTONIX) 40 MG tablet  Daily        01/19/22 0510    ondansetron (ZOFRAN-ODT) 4 MG disintegrating tablet  Every 8 hours PRN        01/19/22 0510              Antony Sian, Barbara Cower, MD 01/19/22 [phone]

## 2022-06-13 ENCOUNTER — Emergency Department (HOSPITAL_BASED_OUTPATIENT_CLINIC_OR_DEPARTMENT_OTHER): Payer: Medicaid Other

## 2022-06-13 ENCOUNTER — Encounter (HOSPITAL_BASED_OUTPATIENT_CLINIC_OR_DEPARTMENT_OTHER): Payer: Self-pay

## 2022-06-13 ENCOUNTER — Emergency Department (HOSPITAL_BASED_OUTPATIENT_CLINIC_OR_DEPARTMENT_OTHER)
Admission: EM | Admit: 2022-06-13 | Discharge: 2022-06-13 | Disposition: A | Discharge status: Home / Self Care | Payer: Medicaid Other | Attending: Emergency Medicine | Admitting: Emergency Medicine

## 2022-06-13 ENCOUNTER — Other Ambulatory Visit: Payer: Self-pay

## 2022-06-13 DIAGNOSIS — Z1152 Encounter for screening for COVID-19: Secondary | ICD-10-CM | POA: Insufficient documentation

## 2022-06-13 DIAGNOSIS — R519 Headache, unspecified: Secondary | ICD-10-CM | POA: Insufficient documentation

## 2022-06-13 LAB — CBC WITH DIFFERENTIAL/PLATELET
Abs Immature Granulocytes: 0.02 10*3/uL (ref 0.00–0.07)
Basophils Absolute: 0 10*3/uL (ref 0.0–0.1)
Basophils Relative: 0 %
Eosinophils Absolute: 0.1 10*3/uL (ref 0.0–0.5)
Eosinophils Relative: 1 %
HCT: 40.9 % (ref 36.0–46.0)
Hemoglobin: 13 g/dL (ref 12.0–15.0)
Immature Granulocytes: 0 %
Lymphocytes Relative: 38 %
Lymphs Abs: 3.2 10*3/uL (ref 0.7–4.0)
MCH: 26.4 pg (ref 26.0–34.0)
MCHC: 31.8 g/dL (ref 30.0–36.0)
MCV: 83 fL (ref 80.0–100.0)
Monocytes Absolute: 0.5 10*3/uL (ref 0.1–1.0)
Monocytes Relative: 6 %
Neutro Abs: 4.6 10*3/uL (ref 1.7–7.7)
Neutrophils Relative %: 55 %
Platelets: 377 10*3/uL (ref 150–400)
RBC: 4.93 MIL/uL (ref 3.87–5.11)
RDW: 13.6 % (ref 11.5–15.5)
WBC: 8.5 10*3/uL (ref 4.0–10.5)
nRBC: 0 % (ref 0.0–0.2)

## 2022-06-13 LAB — COMPREHENSIVE METABOLIC PANEL
ALT: 9 U/L (ref 0–44)
AST: 12 U/L — ABNORMAL LOW (ref 15–41)
Albumin: 3.9 g/dL (ref 3.5–5.0)
Alkaline Phosphatase: 69 U/L (ref 38–126)
Anion gap: 8 (ref 5–15)
BUN: 16 mg/dL (ref 6–20)
CO2: 22 mmol/L (ref 22–32)
Calcium: 8.8 mg/dL — ABNORMAL LOW (ref 8.9–10.3)
Chloride: 106 mmol/L (ref 98–111)
Creatinine, Ser: 0.8 mg/dL (ref 0.44–1.00)
GFR, Estimated: >60 mL/min (ref >60)
Glucose, Bld: 95 mg/dL (ref 70–99)
Potassium: 4 mmol/L (ref 3.5–5.1)
Sodium: 136 mmol/L (ref 135–145)
Total Bilirubin: 0.2 mg/dL — ABNORMAL LOW (ref 0.3–1.2)
Total Protein: 7.2 g/dL (ref 6.5–8.1)

## 2022-06-13 LAB — TROPONIN I (HIGH SENSITIVITY): Troponin I (High Sensitivity): <2 ng/L (ref <18)

## 2022-06-13 LAB — RESP PANEL BY RT-PCR (RSV, FLU A&B, COVID)  RVPGX2
Influenza A by PCR: NEGATIVE
Influenza B by PCR: NEGATIVE
Resp Syncytial Virus by PCR: NEGATIVE
SARS Coronavirus 2 by RT PCR: NEGATIVE

## 2022-06-13 LAB — LIPASE, BLOOD: Lipase: 36 U/L (ref 11–51)

## 2022-06-13 MED ORDER — LACTATED RINGERS IV BOLUS
1000.0000 mL | Freq: Once | INTRAVENOUS | Status: AC
  Administered 2022-06-13: 1000 mL via INTRAVENOUS

## 2022-06-13 MED ORDER — ONDANSETRON HCL 4 MG/2ML IJ SOLN
4.0000 mg | Freq: Once | INTRAMUSCULAR | Status: AC | PRN
  Administered 2022-06-13: 4 mg via INTRAVENOUS
  Filled 2022-06-13: qty 2

## 2022-06-13 MED ORDER — KETOROLAC TROMETHAMINE 15 MG/ML IJ SOLN
15.0000 mg | Freq: Once | INTRAMUSCULAR | Status: AC
  Administered 2022-06-13: 15 mg via INTRAVENOUS
  Filled 2022-06-13: qty 1

## 2022-06-13 MED ORDER — DIPHENHYDRAMINE HCL 25 MG PO CAPS
25.0000 mg | ORAL_CAPSULE | Freq: Once | ORAL | Status: AC
  Administered 2022-06-13: 25 mg via ORAL
  Filled 2022-06-13: qty 1

## 2022-06-13 NOTE — Discharge Instructions (Signed)
You have been seen today for your complaint of headache. Your lab work was overall reassuring. Your imaging was reassuring and showed no abnormalities. Your discharge medications include Alternate tylenol and ibuprofen for pain. You may alternate these every 4 hours. You may take up to 800 mg of ibuprofen at a time and up to 1000 mg of tylenol.. Home care instructions are as follows:  Drink plenty of water and eat a normal diet Follow up with: Your primary care provider as needed Please seek immediate medical care if you develop any of the following symptoms: Your migraine headache becomes severe or lasts more than 72 hours. You have a fever or stiff neck. You have vision loss. Your muscles feel weak or like you cannot control them. You lose your balance often or have trouble walking. You faint. You have a seizure. At this time there does not appear to be the presence of an emergent medical condition, however there is always the potential for conditions to change. Please read and follow the below instructions.  Do not take your medicine if  develop an itchy rash, swelling in your mouth or lips, or difficulty breathing; call 911 and seek immediate emergency medical attention if this occurs.  You may review your lab tests and imaging results in their entirety on your MyChart account.  Please discuss all results of fully with your primary care provider and other specialist at your follow-up visit.  Note: Portions of this text may have been transcribed using voice recognition software. Every effort was made to ensure accuracy; however, inadvertent computerized transcription errors may still be present.

## 2022-06-13 NOTE — ED Triage Notes (Signed)
Pt states she woke up at 0600 with a headache and took 2 Advil @ 0830 but not better - feels worse now with nausea and, pain in neck and shoulders.  Pt states she had CP last night.

## 2022-06-13 NOTE — ED Provider Notes (Signed)
[place] EMERGENCY DEPARTMENT AT [facility] Provider Note   CSN: [id number] Arrival date & time: 06/13/22  1911     History  Chief Complaint  Patient presents with   Headache    Paige Mcfarland is a 27 y.o. female.  With no significant past medical history who presents to the ED for evaluation of headache.  States it began shortly after waking up this morning.  It has progressively gotten worse throughout the day.  Denies history of migraines.  Localizes the headache in a headband distribution and involving the left side of her face.  States the pain also goes on the left side of her neck.  She denies neck stiffness, fevers, nausea, vomiting, vision changes, numbness, weakness, tingling, shortness of breath, nausea, vomiting.  She had some chest pain last night but does not have any currently.  She drank some coffee last night which she believes may be the cause of her symptoms.  The headache was not sudden in onset.   Headache      Home Medications Prior to Admission medications   Medication Sig Start Date End Date Taking? Authorizing Provider  ondansetron (ZOFRAN-ODT) 4 MG disintegrating tablet Take 1 tablet (4 mg total) by mouth every 8 (eight) hours as needed.  ODT q4 hours prn nausea/vomit 01/19/22   Mesner, Barbara Cower, MD  pantoprazole (PROTONIX) 40 MG tablet Take 1 tablet (40 mg total) by mouth daily for 10 days. 01/19/22 01/29/22  Mesner, Barbara Cower, MD      Allergies    Patient has no known allergies.    Review of Systems   Review of Systems  Neurological:  Positive for headaches.  All other systems reviewed and are negative.   Physical Exam Updated Vital Signs BP (!) 124/91 (BP Location: Left Arm)   Pulse 90   Temp 98.4 F (36.9 C) (Oral)   Resp 16   Ht  (1.651 m)   Wt 72.6 kg   LMP 06/10/2022   SpO2 99%   BMI 26.63 kg/m  Physical Exam Vitals and nursing note reviewed.  Constitutional:      General: She is not in acute distress.     Appearance: She is well-developed.  HENT:     Head: Normocephalic and atraumatic.  Eyes:     Extraocular Movements: Extraocular movements intact.     Conjunctiva/sclera: Conjunctivae normal.     Pupils: Pupils are equal, round, and reactive to light.  Neck:     Meningeal: Brudzinski's sign and Kernig's sign absent.  Cardiovascular:     Rate and Rhythm: Normal rate and regular rhythm.     Heart sounds: No murmur heard. Pulmonary:     Effort: Pulmonary effort is normal. No respiratory distress.     Breath sounds: Normal breath sounds. No wheezing, rhonchi or rales.  Abdominal:     Palpations: Abdomen is soft.     Tenderness: There is no abdominal tenderness.  Musculoskeletal:        General: No swelling.     Cervical back: Normal range of motion and neck supple.  Skin:    General: Skin is warm and dry.     Capillary Refill: Capillary refill takes less than 2 seconds.  Neurological:     Mental Status: She is alert and oriented to person, place, and time.  Psychiatric:        Mood and Affect: Mood normal.     ED Results / Procedures / Treatments   Labs (all labs ordered are  listed, but only abnormal results are displayed) Labs Reviewed  COMPREHENSIVE METABOLIC PANEL - Abnormal; Notable for the following components:      Result Value   Calcium 8.8 (*)    AST 12 (*)    Total Bilirubin 0.2 (*)    All other components within normal limits  RESP PANEL BY RT-PCR (RSV, FLU A&B, COVID)  [id number]  CBC WITH DIFFERENTIAL/PLATELET  LIPASE, BLOOD  URINALYSIS, ROUTINE W REFLEX MICROSCOPIC  PREGNANCY, URINE  TROPONIN I (HIGH SENSITIVITY)  TROPONIN I (HIGH SENSITIVITY)    EKG EKG Interpretation  Date/Time:  Sunday June 13 2022 19:27:16 EDT Ventricular Rate:  90 PR Interval:  146 QRS Duration: 86 QT Interval:  360 QTC Calculation: 441 R Axis:   80 Text Interpretation: Sinus rhythm Confirmed by Vivien Rossetti ([id number]) on 06/13/2022 7:50:23 PM  Radiology CT Head Wo  Contrast  Result Date: 06/13/2022 CLINICAL DATA:  bad headache EXAM: CT HEAD WITHOUT CONTRAST TECHNIQUE: Contiguous axial images were obtained from the base of the skull through the vertex without intravenous contrast. RADIATION DOSE REDUCTION: This exam was performed according to the departmental dose-optimization program which includes automated exposure control, adjustment of the mA and/or kV according to patient size and/or use of iterative reconstruction technique. COMPARISON:  None Available. FINDINGS: Brain: No intracranial hemorrhage, mass effect, or midline shift. No hydrocephalus. The basilar cisterns are patent. No evidence of territorial infarct or acute ischemia. No extra-axial or intracranial fluid collection. Vascular: No hyperdense vessel or unexpected calcification. Skull: No fracture or focal lesion. Sinuses/Orbits: Paranasal sinuses and mastoid air cells are clear. The visualized orbits are unremarkable. Other: None. IMPRESSION: Negative noncontrast head CT. Electronically Signed   By: Narda Rutherford M.D.   On: 06/13/2022 20:35   DG Chest 2 View  Result Date: 06/13/2022 CLINICAL DATA:  Chest pain EXAM: CHEST - 2 VIEW COMPARISON:  Chest radiograph and CTA chest 08/05/2020 FINDINGS: The cardiomediastinal silhouette is normal There is no focal consolidation or pulmonary edema. There is no pleural effusion or pneumothorax There is no acute osseous abnormality. IMPRESSION: No radiographic evidence of acute cardiopulmonary process. Electronically Signed   By: Lesia Hausen M.D.   On: 06/13/2022 20:12    Procedures Procedures    Medications Ordered in ED Medications  ondansetron (ZOFRAN) injection 4 mg (4 mg Intravenous Given 06/13/22 1953)  lactated ringers bolus 1,000 mL (1,000 mLs Intravenous New Bag/Given 06/13/22 2037)  ketorolac (TORADOL) 15 MG/ML injection 15 mg (15 mg Intravenous Given 06/13/22 2121)  diphenhydrAMINE (BENADRYL) capsule 25 mg (25 mg Oral Provided for home use  06/13/22 2218)    ED Course/ Medical Decision Making/ A&P                             Medical Decision Making Amount and/or Complexity of Data Reviewed Labs: ordered. Radiology: ordered.  Risk Prescription drug management.  This patient presents to the ED for concern of headache, this involves an extensive number of treatment options, and is a complaint that carries with it a high risk of complications and morbidity.  Emergent considerations for headache include subarachnoid hemorrhage, meningitis, temporal arteritis, glaucoma, cerebral ischemia, carotid/vertebral dissection, intracranial tumor, Venous sinus thrombosis, carbon monoxide poisoning, acute or chronic subdural hemorrhage.  Other considerations include: Migraine, Cluster headache, Hypertension, Caffeine, alcohol, or drug withdrawal, Pseudotumor cerebri, Arteriovenous malformation, Head injury, Neurocysticercosis, Post-lumbar puncture, Preeclampsia, Tension headache, Sinusitis, Cervical arthritis, Refractive error causing strain, Dental abscess, Otitis media, Temporomandibular joint syndrome,  Depression, Somatoform disorder (eg, somatization) Trigeminal neuralgia, Glossopharyngeal neuralgia.  My initial workup includes labs, imaging, headache cocktail  Additional history obtained from: Nursing notes from this visit  I ordered, reviewed and interpreted labs which include: Respiratory panel, CBC, CMP, lipase, troponin  I ordered imaging studies including chest x-ray, CT head I independently visualized and interpreted imaging which showed normal chest x-ray and CT head I agree with the radiologist interpretation  Afebrile, hemodynamically stable.  27 year old female presents ED for evaluation of a headache.  On exam, she is resting comfortably in bed.  She is in no acute distress.  She has no focal neurologic deficits.  No meningismus.  She did have some chest pain yesterday but currently has none.  EKG without ischemic changes.   Chest x-ray normal.  Troponin negative.  Her headache was not sudden onset.  No neurologic complaints.  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  Also drank coffee last night which she normally does not do.  Patient reported significant improvement in his symptoms after treatment in the ED.  She was requesting discharge home.  Believe this is appropriate.  She was encouraged to follow-up with her primary care provider in 1 week for reevaluation of her symptoms.  She was given return precautions.  Stable at discharge.  At this time there does not appear to be any evidence of an acute emergency medical condition and the patient appears stable for discharge with appropriate outpatient follow up. Diagnosis was discussed with patient who verbalizes understanding of care plan and is agreeable to discharge. I have discussed return precautions with patient who verbalizes understanding. Patient encouraged to follow-up with their PCP within 1 week. All questions answered.  Note: Portions of this report may have been transcribed using voice recognition software. Every effort was made to ensure accuracy; however, inadvertent computerized transcription errors may still be present.        Final Clinical Impression(s) / ED Diagnoses Final diagnoses:  Bad headache    Rx / DC Orders ED Discharge Orders     None         Mora Bellman 06/13/22 2219    Mardene Sayer, MD 06/14/22 [phone]

## 2022-08-11 IMAGING — CR DG FOOT COMPLETE 3+V*L*
3 series · 3 of 3 positions shown · non-contrast
Comparison: None.

CLINICAL DATA: Twisting injury yesterday with lateral foot pain,
initial encounter

EXAM:
LEFT FOOT - COMPLETE 3+ VIEW

[x foot ap left]
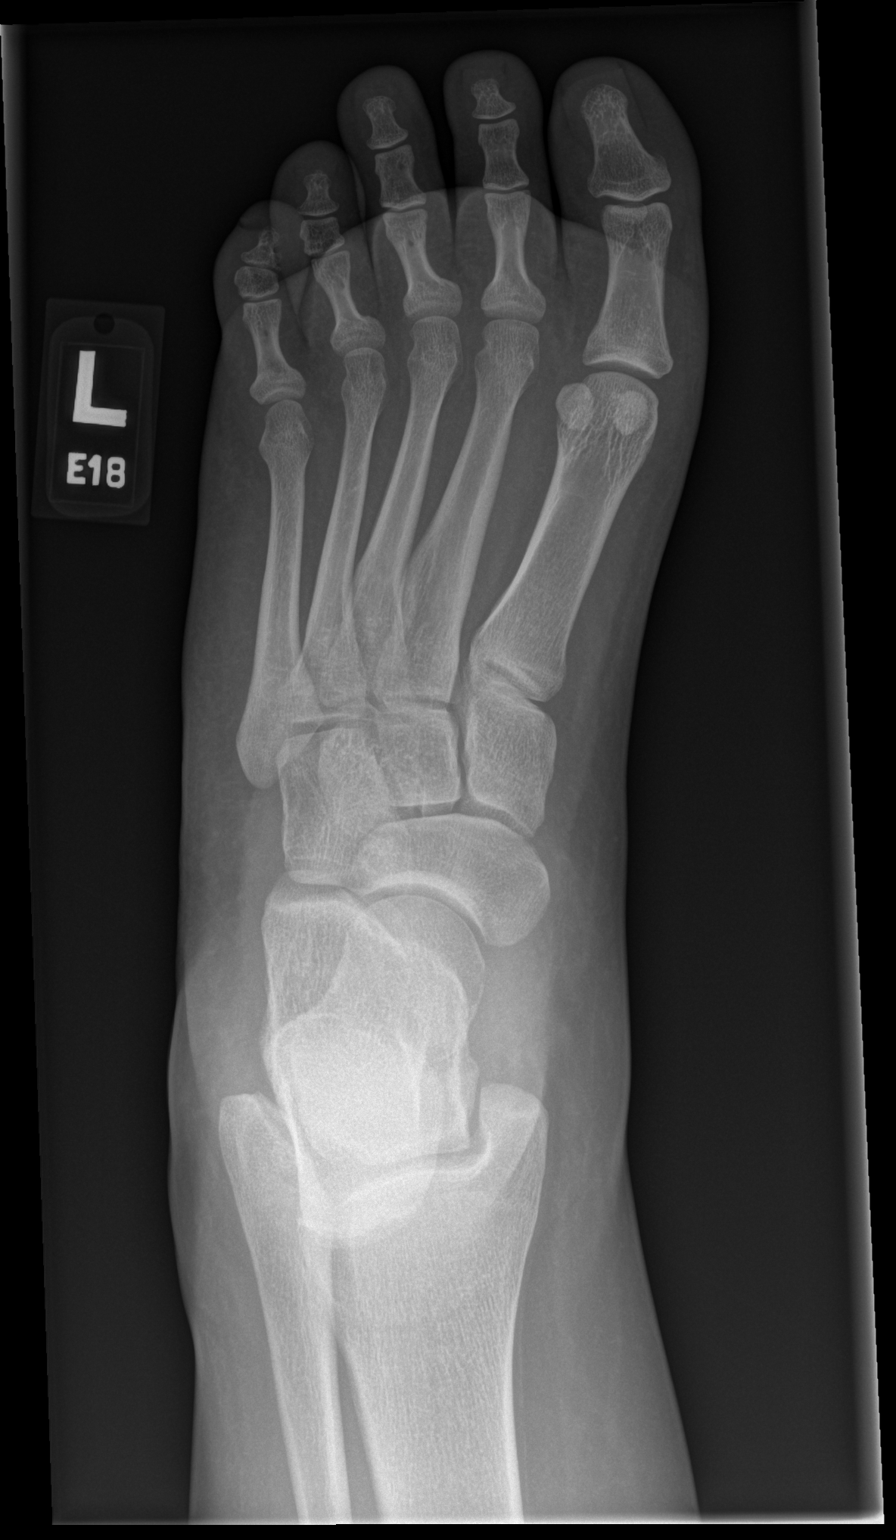

[x foot obl left]
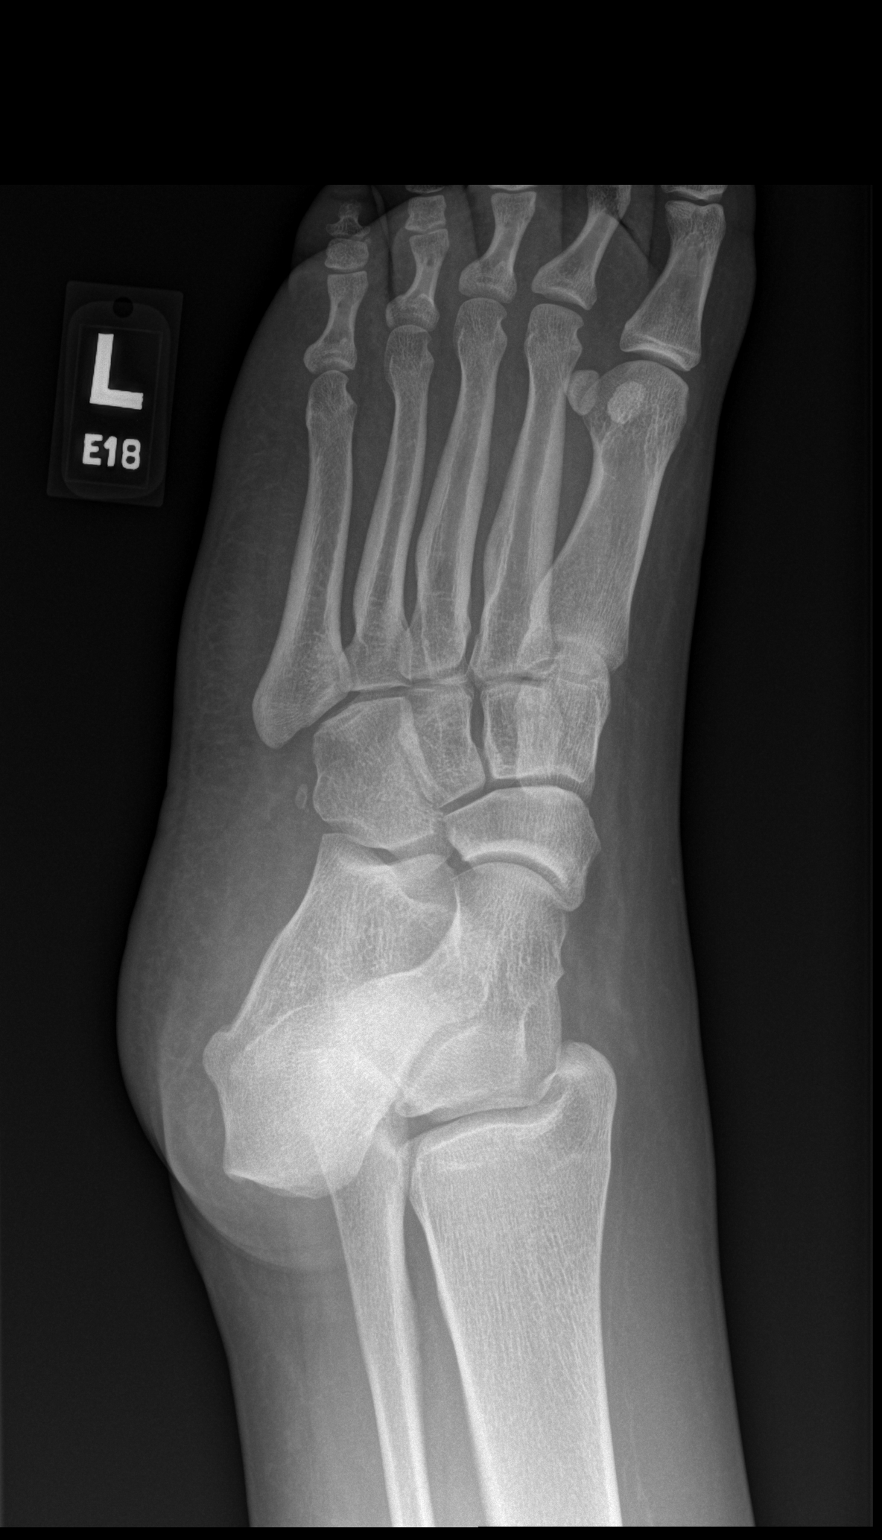

[x foot lat left]
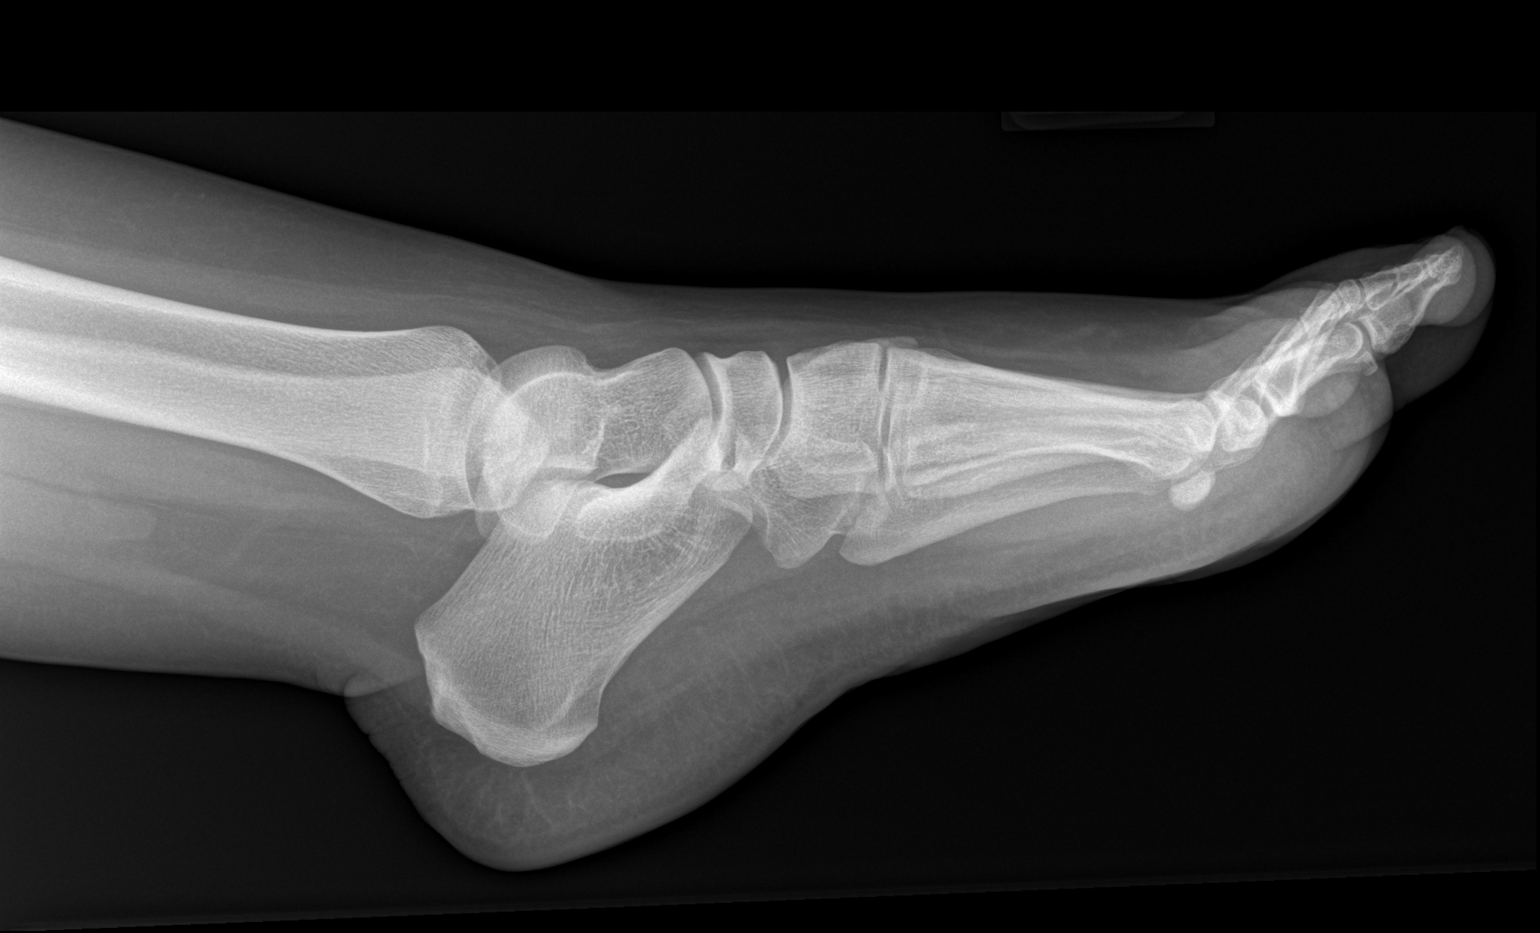

[3 of 3 positions shown; findings below may reference images not displayed]

FINDINGS: There is no evidence of fracture or dislocation. There is no
evidence of arthropathy or other focal bone abnormality. Soft
tissues are unremarkable.
IMPRESSION: No acute abnormality noted.

## 2022-08-31 IMAGING — CT CT ANGIO CHEST
2 of 8 series · 19 of 36 positions shown · IV contrast (omnipaque)
Comparison: None.

CLINICAL DATA: Shortness of breath, cough.  COVID.

EXAM:
CT ANGIOGRAPHY CHEST WITH CONTRAST
TECHNIQUE: Multidetector CT imaging of the chest was performed using the
standard protocol during bolus administration of intravenous
contrast. Multiplanar CT image reconstructions and MIPs were
obtained to evaluate the vascular anatomy.
CONTRAST:  100mL OMNIPAQUE IOHEXOL 350 MG/ML SOLN

[Series 6: pe thins · axial · 0.82mm/px · z∈[+901,+1106]mm · 18 of 305 slices shown]
[im 16/305  lung]
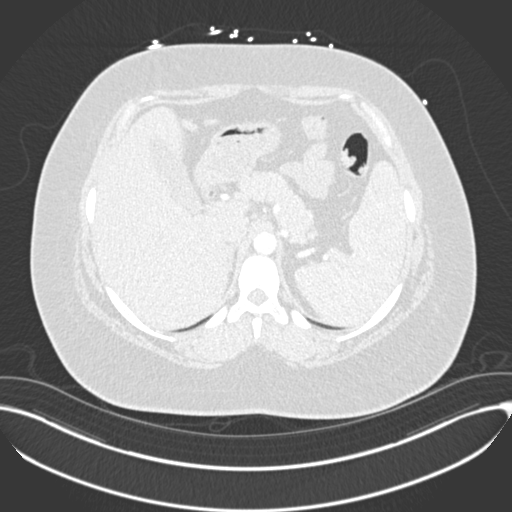
[im 31/305  mediastinal]
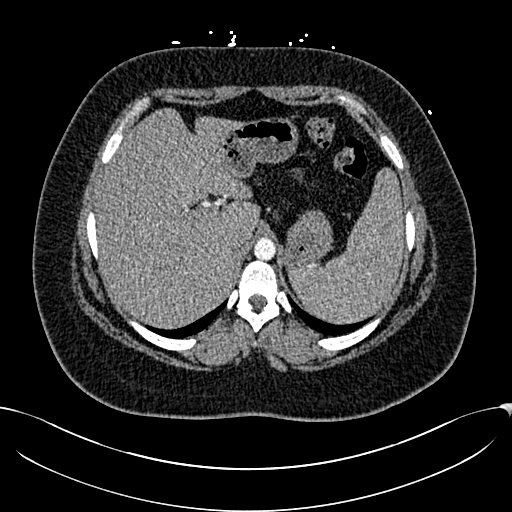
[im 46/305  lung]
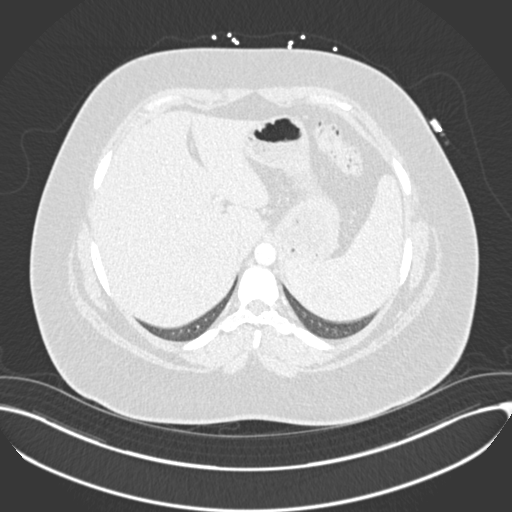
[im 61/305  mediastinal]
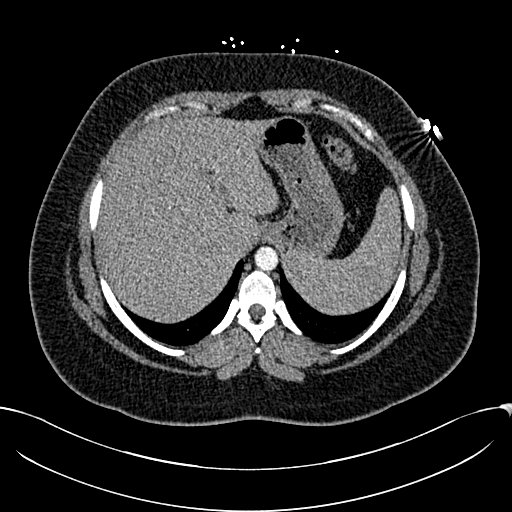
[im 77/305  lung]
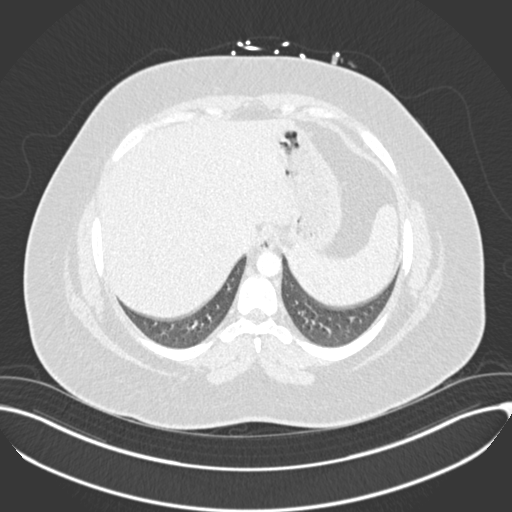
[im 92/305  mediastinal]
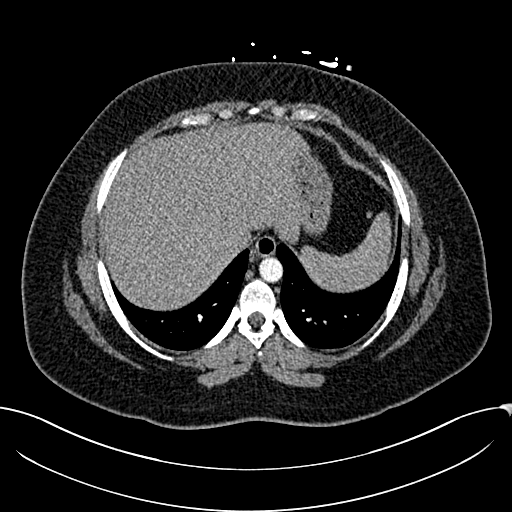
[im 107/305  lung]
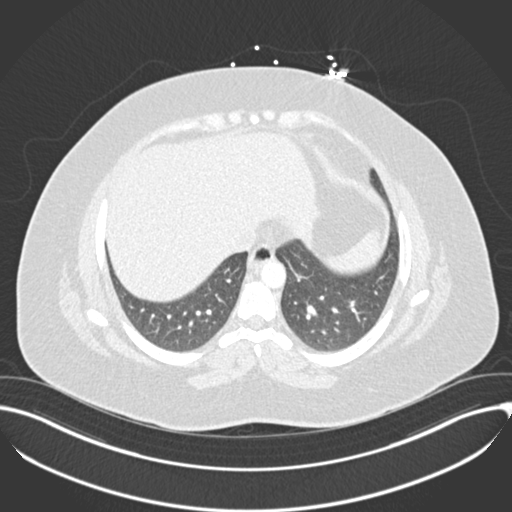
[im 122/305  mediastinal]
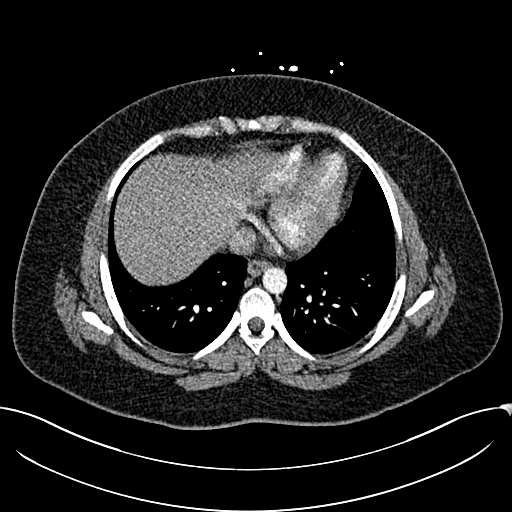
[im 137/305  lung]
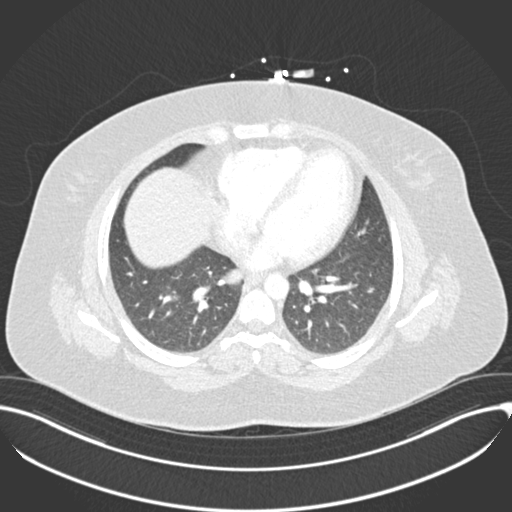
[im 168/305  mediastinal]
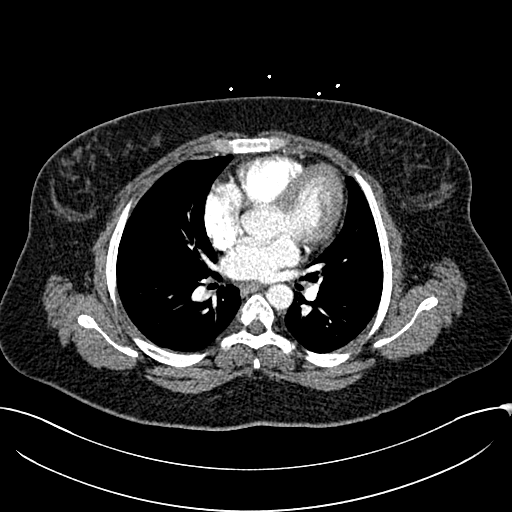
[im 183/305  lung]
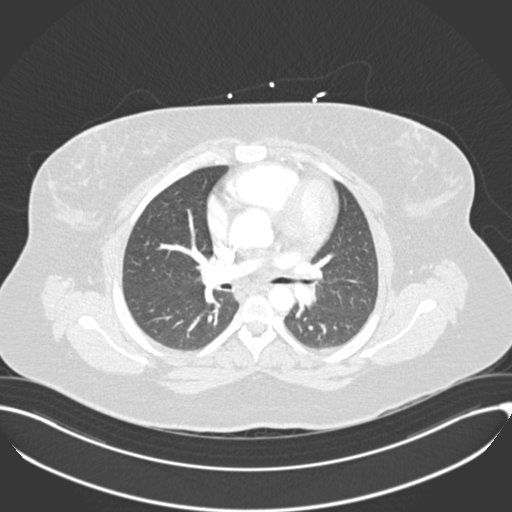
[im 198/305  mediastinal]
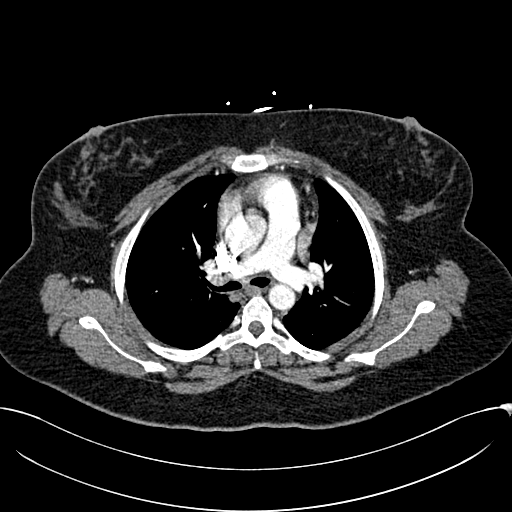
[im 213/305  lung]
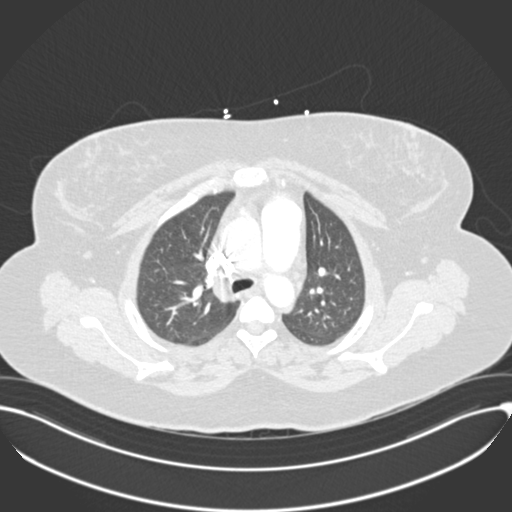
[im 229/305  mediastinal]
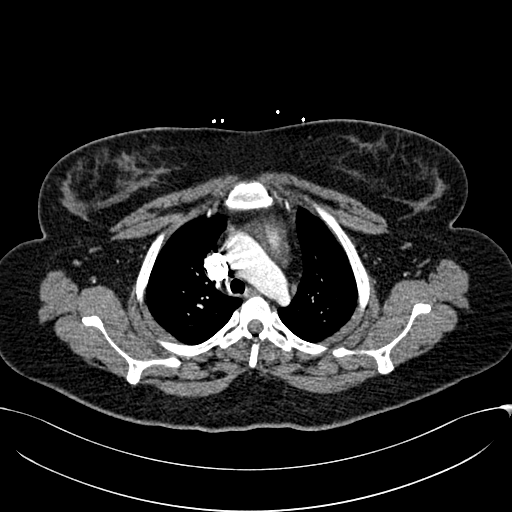
[im 244/305  lung]
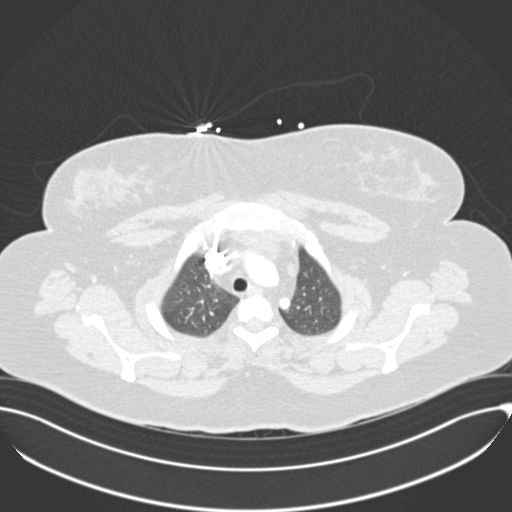
[im 259/305  mediastinal]
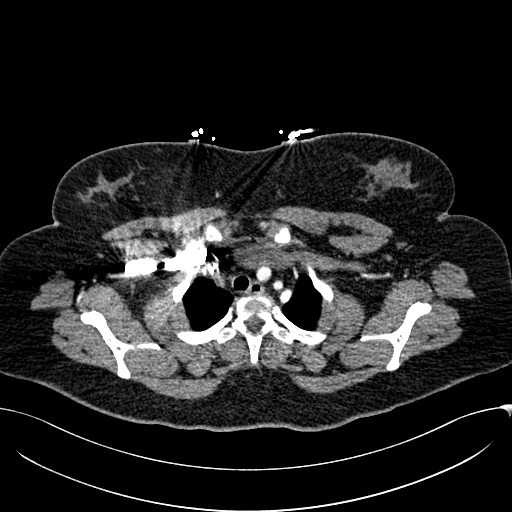
[im 274/305  lung]
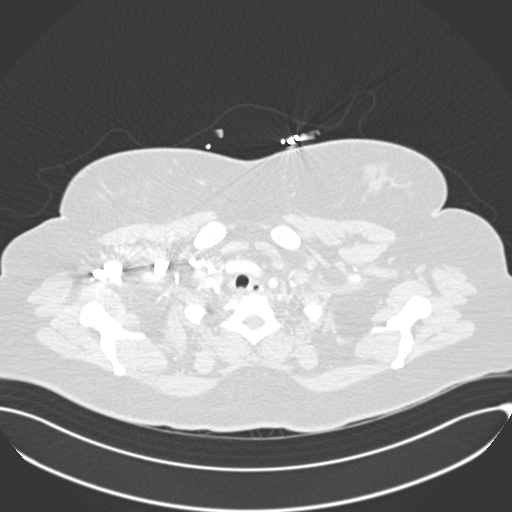
[im 289/305  mediastinal]
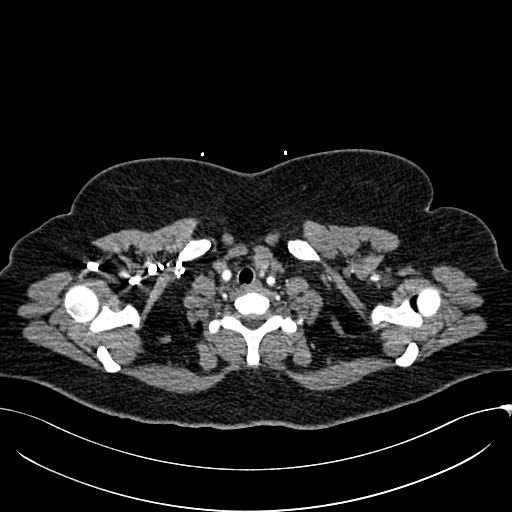

[Series 7: pe coronal mpr · coronal · 0.46mm/px · 1 of 85 slices shown]
[im 43/85  mediastinal]
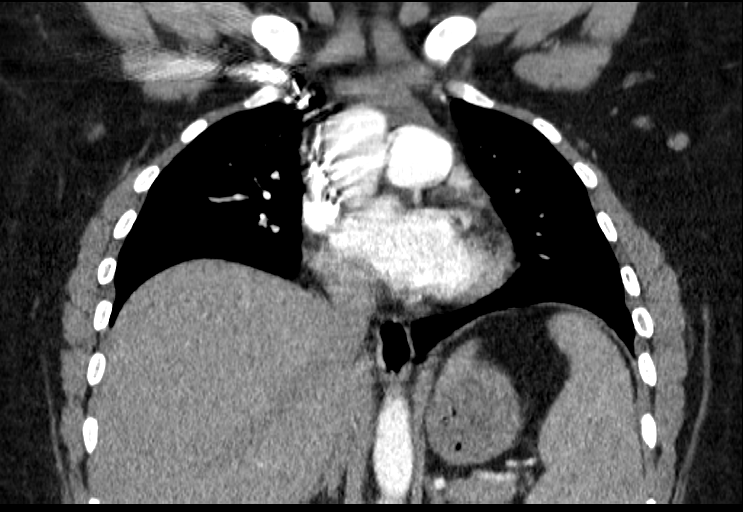

[19 of 36 positions shown; findings below may reference images not displayed]

FINDINGS: Cardiovascular: No filling defects in the pulmonary arteries to
suggest pulmonary emboli. Heart is normal size. Aorta is normal
caliber.

Mediastinum/Nodes: No mediastinal, hilar, or axillary adenopathy.
Trachea and esophagus are unremarkable. Thyroid unremarkable.

Lungs/Pleura: Lungs are clear. No focal airspace opacities or
suspicious nodules. No effusions.

Upper Abdomen: Imaging into the upper abdomen demonstrates no acute
findings.

Musculoskeletal: Chest wall soft tissues are unremarkable. No acute
bony abnormality.

Review of the MIP images confirms the above findings.
IMPRESSION: No evidence of pulmonary embolus.

No acute cardiopulmonary disease.
